# Patient Record
Sex: Female | Born: 1995 | Race: Black or African American | Hispanic: No | Marital: Single | Smoking: Never smoker
Health system: Southern US, Community
[De-identification: ages and names within clinical notes are randomized; demographics above are authoritative.]

## PROBLEM LIST (undated history)

## (undated) ENCOUNTER — Inpatient Hospital Stay (HOSPITAL_COMMUNITY): Payer: Self-pay

## (undated) DIAGNOSIS — R55 Syncope and collapse: Secondary | ICD-10-CM

## (undated) DIAGNOSIS — E669 Obesity, unspecified: Secondary | ICD-10-CM

## (undated) DIAGNOSIS — N946 Dysmenorrhea, unspecified: Secondary | ICD-10-CM

## (undated) DIAGNOSIS — F39 Unspecified mood [affective] disorder: Secondary | ICD-10-CM

## (undated) DIAGNOSIS — Z8619 Personal history of other infectious and parasitic diseases: Secondary | ICD-10-CM

## (undated) HISTORY — PX: WISDOM TOOTH EXTRACTION: SHX21

## (undated) HISTORY — DX: Personal history of other infectious and parasitic diseases: Z86.19

## (undated) HISTORY — PX: EYE SURGERY: SHX253

## (undated) HISTORY — PX: TYMPANOSTOMY TUBE PLACEMENT: SHX32

---
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
## 2015-01-15 NOTE — L&D Delivery Note (Addendum)
Pt complete and at +2 station with urge to push. Epidural controlling pain. Pt pushed twice to deliver a viable female infant in LOA position over intact perineum. Nuchal x 1 reduced over infants head. Anterior and posterior shoulders spontaneously delivered with next two pushes; body easily followed next. Infant placed on mothers abdomen and bulb suction of mouth and nose performed. Cord was then clamped and cut by FOB. Cord blood obtained. Baby had a vigorous spontaneous cry noted. Placenta then delivered about 3 mins later intact, 3VC shultz. Fundal massage performed and pitocin per protocol. Fundus firm.Vaginal inspection noted two small periurethral lacs which were each repaired with one figure 8 stitch using 4-0 vicryl suture. A left labial cyst was punctured and frained of fluid. Mother and baby stable. Counts correct

## 2015-01-18 ENCOUNTER — Inpatient Hospital Stay (HOSPITAL_COMMUNITY)
Admission: AD | Admit: 2015-01-18 | Discharge: 2015-01-18 | Disposition: A | Discharge status: Home / Self Care | Payer: Medicaid Other | Source: Ambulatory Visit | Attending: Obstetrics and Gynecology | Admitting: Obstetrics and Gynecology

## 2015-01-18 ENCOUNTER — Encounter (HOSPITAL_COMMUNITY): Payer: Self-pay | Admitting: *Deleted

## 2015-01-18 DIAGNOSIS — Z3A38 38 weeks gestation of pregnancy: Secondary | ICD-10-CM | POA: Diagnosis not present

## 2015-01-18 DIAGNOSIS — N898 Other specified noninflammatory disorders of vagina: Secondary | ICD-10-CM | POA: Insufficient documentation

## 2015-01-18 DIAGNOSIS — O26893 Other specified pregnancy related conditions, third trimester: Secondary | ICD-10-CM | POA: Diagnosis not present

## 2015-01-18 LAB — POCT FERN TEST: POCT FERN TEST: NEGATIVE

## 2015-01-18 NOTE — MAU Note (Signed)
Been having trickling in underwear, noted this morning. Also noted some mucous. No blood. No pains today.

## 2015-01-18 NOTE — Progress Notes (Signed)
May D/C home 

## 2015-02-01 ENCOUNTER — Telehealth (HOSPITAL_COMMUNITY): Payer: Self-pay | Admitting: *Deleted

## 2015-02-01 ENCOUNTER — Encounter (HOSPITAL_COMMUNITY): Payer: Self-pay | Admitting: *Deleted

## 2015-02-01 ENCOUNTER — Inpatient Hospital Stay (HOSPITAL_COMMUNITY): Admission: RE | Admit: 2015-02-01 | Payer: Medicaid Other | Source: Ambulatory Visit

## 2015-02-01 NOTE — Telephone Encounter (Signed)
Preadmission screen  

## 2015-02-02 ENCOUNTER — Encounter (HOSPITAL_COMMUNITY): Payer: Self-pay

## 2015-02-02 ENCOUNTER — Inpatient Hospital Stay (HOSPITAL_COMMUNITY)
Admission: RE | Admit: 2015-02-02 | Discharge: 2015-02-05 | DRG: 775 | Disposition: A | Discharge status: Home / Self Care | Payer: Medicaid Other | Source: Ambulatory Visit | Attending: Obstetrics and Gynecology | Admitting: Obstetrics and Gynecology

## 2015-02-02 DIAGNOSIS — Z833 Family history of diabetes mellitus: Secondary | ICD-10-CM | POA: Diagnosis not present

## 2015-02-02 DIAGNOSIS — O480 Post-term pregnancy: Principal | ICD-10-CM | POA: Diagnosis present

## 2015-02-02 DIAGNOSIS — Z3A40 40 weeks gestation of pregnancy: Secondary | ICD-10-CM

## 2015-02-02 DIAGNOSIS — Z8249 Family history of ischemic heart disease and other diseases of the circulatory system: Secondary | ICD-10-CM

## 2015-02-02 LAB — CBC
HEMATOCRIT: 35.1 % — AB (ref 36.0–46.0)
Hemoglobin: 12.2 g/dL (ref 12.0–15.0)
MCH: 28 pg (ref 26.0–34.0)
MCHC: 34.8 g/dL (ref 30.0–36.0)
MCV: 80.7 fL (ref 78.0–100.0)
Platelets: 268 10*3/uL (ref 150–400)
RBC: 4.35 MIL/uL (ref 3.87–5.11)
RDW: 15 % (ref 11.5–15.5)
WBC: 8.9 10*3/uL (ref 4.0–10.5)

## 2015-02-02 LAB — TYPE AND SCREEN
ABO/RH(D): AB POS
ANTIBODY SCREEN: NEGATIVE

## 2015-02-02 LAB — RPR: RPR Ser Ql: NONREACTIVE

## 2015-02-02 LAB — ABO/RH: ABO/RH(D): AB POS

## 2015-02-02 MED ORDER — TERBUTALINE SULFATE 1 MG/ML IJ SOLN
0.2500 mg | Freq: Once | INTRAMUSCULAR | Status: DC | PRN
  Filled 2015-02-02: qty 1

## 2015-02-02 MED ORDER — LACTATED RINGERS IV SOLN: 500.0000 mL | INTRAVENOUS | Status: DC | PRN

## 2015-02-02 MED ORDER — OXYCODONE-ACETAMINOPHEN 5-325 MG PO TABS: 2.0000 | ORAL_TABLET | ORAL | Status: DC | PRN

## 2015-02-02 MED ORDER — OXYTOCIN 10 UNIT/ML IJ SOLN
2.5000 [IU]/h | INTRAMUSCULAR | Status: DC
  Filled 2015-02-02: qty 4

## 2015-02-02 MED ORDER — LIDOCAINE HCL (PF) 1 % IJ SOLN
30.0000 mL | INTRAMUSCULAR | Status: AC | PRN
  Administered 2015-02-03: 30 mL via SUBCUTANEOUS
  Filled 2015-02-02: qty 30

## 2015-02-02 MED ORDER — OXYTOCIN BOLUS FROM INFUSION
500.0000 mL | INTRAVENOUS | Status: DC
  Administered 2015-02-03: 500 mL via INTRAVENOUS

## 2015-02-02 MED ORDER — OXYTOCIN 10 UNIT/ML IJ SOLN
1.0000 m[IU]/min | INTRAMUSCULAR | Status: DC
  Administered 2015-02-02: 2 m[IU]/min via INTRAVENOUS

## 2015-02-02 MED ORDER — OXYCODONE-ACETAMINOPHEN 5-325 MG PO TABS: 1.0000 | ORAL_TABLET | ORAL | Status: DC | PRN

## 2015-02-02 MED ORDER — BUTORPHANOL TARTRATE 1 MG/ML IJ SOLN
2.0000 mg | Freq: Once | INTRAMUSCULAR | Status: AC
  Administered 2015-02-02: 2 mg via INTRAVENOUS
  Filled 2015-02-02: qty 2

## 2015-02-02 MED ORDER — MISOPROSTOL 25 MCG QUARTER TABLET
25.0000 ug | ORAL_TABLET | ORAL | Status: DC | PRN
  Administered 2015-02-02 (×3): 25 ug via VAGINAL
  Filled 2015-02-02: qty 0.25
  Filled 2015-02-02: qty 1
  Filled 2015-02-02 (×2): qty 0.25

## 2015-02-02 MED ORDER — LACTATED RINGERS IV SOLN
INTRAVENOUS | Status: DC
  Administered 2015-02-02: 08:00:00 via INTRAVENOUS
  Administered 2015-02-03: 1000 mL via INTRAVENOUS

## 2015-02-02 MED ORDER — ONDANSETRON HCL 4 MG/2ML IJ SOLN: 4.0000 mg | Freq: Four times a day (QID) | INTRAMUSCULAR | Status: DC | PRN

## 2015-02-02 MED ORDER — CITRIC ACID-SODIUM CITRATE 334-500 MG/5ML PO SOLN: 30.0000 mL | ORAL | Status: DC | PRN

## 2015-02-02 MED ORDER — ACETAMINOPHEN 325 MG PO TABS: 650.0000 mg | ORAL_TABLET | ORAL | Status: DC | PRN

## 2015-02-02 NOTE — Progress Notes (Signed)
Patient ID: Alicia Ibarra, female   DOB: August 01, 1995, 20 y.o.   MRN: [id number] Pt reports +Fms, no VB, LOF or painful contractions, SVE - soft but still fingertip EFM- 155 , cat 1 TOCO- rare contractions SVE - ft/75/-2 very soft, mid position  A/P: Second dose of cytotec placed         Reasses in 3-4hrs         Anticipate svd         Pain control prn

## 2015-02-02 NOTE — Progress Notes (Signed)
Patient ID: Alicia Ibarra, female   DOB: Jun 21, 1995, 20 y.o.   MRN: [id number] Pt doing well. No VB or LOF. +Fms. Noting some 'tightening" with contractions VSS Cat 1 strip with baseline 150s TOCO - contractions q 4-91mins SVE 1/80/-2  A/P: third dose of cytotec placed         reassess in 3-4weeks or prn         Anticipate svd          Pain control prn

## 2015-02-02 NOTE — Progress Notes (Signed)
Patient ID: Alicia Ibarra, female   DOB: 1995/02/18, 20 y.o.   MRN: [id number] Pt had a shower and feels well. No VB, LOF or painful contractions Cat 1 strip Contractiosn q 2-29mins SVE 1.5cm/80/-2  A/P: Attempted AROM but not successful        Will proceed with pitocin and recheck in 3hrs or prn        Will attempt AROM again when able        May consider foley bulb if no further dilation on next check

## 2015-02-02 NOTE — H&P (Signed)
Alicia Ibarra is a 20 y.o. female presenting for scheduled iol for postdates at 39 2/[redacted]wks gestation. Pt has had a benign prenatal course. She begun care at [redacted]wks gestation and first trimester u/s corresponded with her lmp. She denies any painful regular contractions, VB or LOF. She is appreciating FMs. Maternal Medical History:  Reason for admission: Nausea. Scheduled iol for postdates  Contractions: Onset was yesterday.   Frequency: irregular.   Perceived severity is mild.    Fetal activity: Perceived fetal activity is normal.   Last perceived fetal movement was within the past hour.    Prenatal complications: no prenatal complications   OB History    Gravida Para Term Preterm AB TAB SAB Ectopic Multiple Living   1              Past Medical History  Diagnosis Date  . Dysmenorrhea in the adolescent   . Obesity   . Syncope   . Mood disorder (HCC)   . Hx of varicella    Past Surgical History  Procedure Laterality Date  . Tympanostomy tube placement    . Wisdom tooth extraction    . Eye surgery     Family History: family history includes Bipolar disorder in her mother; Diabetes in her father; Heart disease in her father; Hypertension in her father; Sudden death in her father. Social History:  reports that she has never smoked. She does not have any smokeless tobacco history on file. She reports that she does not drink alcohol or use illicit drugs.   Prenatal Transfer Tool  Maternal Diabetes: No Genetic Screening: Normal Maternal Ultrasounds/Referrals: Normal Fetal Ultrasounds or other Referrals:  None Maternal Substance Abuse:  No Significant Maternal Medications:  None Significant Maternal Lab Results:  Lab values include: Group B Strep negative Other Comments:  None  Review of Systems  Constitutional: Negative for fever, chills, weight loss and malaise/fatigue.  Eyes: Negative for blurred vision.  Respiratory: Negative for shortness of breath.   Cardiovascular:  Negative for chest pain.  Gastrointestinal: Negative for heartburn, nausea, vomiting, abdominal pain and diarrhea.  Genitourinary: Negative for dysuria.  Musculoskeletal: Positive for back pain.  Neurological: Negative for dizziness and headaches.  Psychiatric/Behavioral: Negative for depression. The patient is not nervous/anxious.     Dilation: Closed Effacement (%): Thick Exam by:: Gifford Shave RN  Blood pressure 122/78, pulse 90, temperature 98.2 F (36.8 C), temperature source Oral, resp. rate 20, height 5' 7.5" (1.715 m), weight 208 lb (94.348 kg). Maternal Exam:  Uterine Assessment: Contraction strength is mild.  Contraction frequency is irregular.   Abdomen: Patient reports generalized tenderness.  Estimated fetal weight is AGA.   Fetal presentation: vertex  Introitus: Normal vulva. Normal vagina.  Pelvis: adequate for delivery.   Cervix: Cervix evaluated by digital exam.     Physical Exam  Constitutional: She is oriented to person, place, and time. She appears well-developed and well-nourished.  HENT:  Head: Normocephalic.  Neck: Normal range of motion.  Cardiovascular: Normal rate.   Respiratory: Effort normal.  GI: Soft. There is generalized tenderness.  Genitourinary: Vagina normal and uterus normal.  Musculoskeletal: Normal range of motion. She exhibits no edema or tenderness.  Neurological: She is alert and oriented to person, place, and time.  Skin: Skin is warm.  Psychiatric: She has a normal mood and affect. Her behavior is normal. Judgment and thought content normal.    Prenatal labs: ABO, Rh: AB/Positive/-- (07/05 0000) Antibody: Negative (07/05 0000) Rubella: Immune (07/05 0000) RPR: Nonreactive (07/05  0000)  HBsAg: Negative (07/05 0000)  HIV: Non-reactive (07/05 0000)  GBS: Negative (12/15 0000)   Assessment/Plan: 19yo G1P0 at 40 2/[redacted]wks gestation for scheduled postdates iol Will begin with cytotec for cervical ripening Reassess in 3-4hours and  proceed prn Pain control as indicated; epidural when in active labor Anticipate svd   Sharol Given Alicia Ibarra 02/02/2015, 8:54 AM

## 2015-02-03 ENCOUNTER — Inpatient Hospital Stay (HOSPITAL_COMMUNITY): Payer: Medicaid Other | Admitting: Anesthesiology

## 2015-02-03 ENCOUNTER — Encounter (HOSPITAL_COMMUNITY): Payer: Self-pay

## 2015-02-03 MED ORDER — ACETAMINOPHEN 325 MG PO TABS: 650.0000 mg | ORAL_TABLET | ORAL | Status: DC | PRN

## 2015-02-03 MED ORDER — ONDANSETRON HCL 4 MG/2ML IJ SOLN: 4.0000 mg | INTRAMUSCULAR | Status: DC | PRN

## 2015-02-03 MED ORDER — ONDANSETRON HCL 4 MG PO TABS: 4.0000 mg | ORAL_TABLET | ORAL | Status: DC | PRN

## 2015-02-03 MED ORDER — ZOLPIDEM TARTRATE 5 MG PO TABS: 5.0000 mg | ORAL_TABLET | Freq: Every evening | ORAL | Status: DC | PRN

## 2015-02-03 MED ORDER — EPHEDRINE 5 MG/ML INJ
10.0000 mg | INTRAVENOUS | Status: DC | PRN
Start: 2015-02-03 — End: 2015-02-03
  Filled 2015-02-03: qty 2

## 2015-02-03 MED ORDER — WITCH HAZEL-GLYCERIN EX PADS: 1.0000 "application " | MEDICATED_PAD | CUTANEOUS | Status: DC | PRN

## 2015-02-03 MED ORDER — IBUPROFEN 600 MG PO TABS
600.0000 mg | ORAL_TABLET | Freq: Four times a day (QID) | ORAL | Status: DC
  Administered 2015-02-03 – 2015-02-04 (×6): 600 mg via ORAL
  Filled 2015-02-03 (×8): qty 1

## 2015-02-03 MED ORDER — SENNOSIDES-DOCUSATE SODIUM 8.6-50 MG PO TABS
2.0000 | ORAL_TABLET | ORAL | Status: DC
  Administered 2015-02-04: 2 via ORAL
  Filled 2015-02-03 (×2): qty 2

## 2015-02-03 MED ORDER — TETANUS-DIPHTH-ACELL PERTUSSIS 5-2.5-18.5 LF-MCG/0.5 IM SUSP: 0.5000 mL | Freq: Once | INTRAMUSCULAR | Status: DC

## 2015-02-03 MED ORDER — DIBUCAINE 1 % RE OINT: 1.0000 "application " | TOPICAL_OINTMENT | RECTAL | Status: DC | PRN

## 2015-02-03 MED ORDER — FENTANYL 2.5 MCG/ML BUPIVACAINE 1/10 % EPIDURAL INFUSION (WH - ANES)
14.0000 mL/h | INTRAMUSCULAR | Status: DC | PRN
  Administered 2015-02-03: 14 mL/h via EPIDURAL
  Filled 2015-02-03: qty 125

## 2015-02-03 MED ORDER — LANOLIN HYDROUS EX OINT: TOPICAL_OINTMENT | CUTANEOUS | Status: DC | PRN

## 2015-02-03 MED ORDER — LIDOCAINE HCL (PF) 1 % IJ SOLN
INTRAMUSCULAR | Status: DC | PRN
  Administered 2015-02-03 (×2): 8 mL via EPIDURAL

## 2015-02-03 MED ORDER — DIPHENHYDRAMINE HCL 25 MG PO CAPS: 25.0000 mg | ORAL_CAPSULE | Freq: Four times a day (QID) | ORAL | Status: DC | PRN

## 2015-02-03 MED ORDER — PHENYLEPHRINE 40 MCG/ML (10ML) SYRINGE FOR IV PUSH (FOR BLOOD PRESSURE SUPPORT)
80.0000 ug | PREFILLED_SYRINGE | INTRAVENOUS | Status: DC | PRN
  Filled 2015-02-03: qty 2
  Filled 2015-02-03: qty 20

## 2015-02-03 MED ORDER — LACTATED RINGERS IV SOLN: INTRAVENOUS | Status: DC

## 2015-02-03 MED ORDER — BUTORPHANOL TARTRATE 2 MG/ML IJ SOLN: 2.0000 mg | Freq: Once | INTRAMUSCULAR | Status: DC

## 2015-02-03 MED ORDER — BENZOCAINE-MENTHOL 20-0.5 % EX AERO: 1.0000 "application " | INHALATION_SPRAY | CUTANEOUS | Status: DC | PRN

## 2015-02-03 MED ORDER — DIPHENHYDRAMINE HCL 50 MG/ML IJ SOLN: 12.5000 mg | INTRAMUSCULAR | Status: DC | PRN

## 2015-02-03 MED ORDER — SIMETHICONE 80 MG PO CHEW: 80.0000 mg | CHEWABLE_TABLET | ORAL | Status: DC | PRN

## 2015-02-03 MED ORDER — PRENATAL MULTIVITAMIN CH
1.0000 | ORAL_TABLET | Freq: Every day | ORAL | Status: DC
  Administered 2015-02-03 – 2015-02-04 (×2): 1 via ORAL
  Filled 2015-02-03 (×2): qty 1

## 2015-02-03 NOTE — Lactation Note (Signed)
This note was copied from the chart of Alicia Ibarra. Lactation Consultation Note  Initial visit made.  Breastfeeding consultation services and support information given and reviewed.  Baby is 7 hours old and has not latched but just spoon fed 5 mls of colostrum.  Baby awake and showing some feeding cues.  Mom has large nipples.  Baby opens and holds breast in mouth with sucking attempted.  Baby not bringing tongue down.  Suck training showed to mom with gloved finger.  Baby continues to pull tongue up or tongue thrusts.  Recommended mom continue skin to skin along with suck training.  Mom receptive to teaching.  Encouraged to watch for feeding cues and to call for assist prn.  Patient Name: Alicia Rickie Gutierres RUEAV'W Date: 02/03/2015 Reason for consult: Initial assessment   Maternal Data Formula Feeding for Exclusion: No Has patient been taught Hand Expression?: Yes Does the patient have breastfeeding experience prior to this delivery?: No  Feeding Feeding Type: Breast Fed Length of feed: 0 min  LATCH Score/Interventions Latch: Repeated attempts needed to sustain latch, nipple held in mouth throughout feeding, stimulation needed to elicit sucking reflex. Intervention(s): Skin to skin;Teach feeding cues;Waking techniques Intervention(s): Breast compression;Breast massage;Assist with latch;Adjust position  Audible Swallowing: None  Type of Nipple: Everted at rest and after stimulation  Comfort (Breast/Nipple): Soft / non-tender     Hold (Positioning): Assistance needed to correctly position infant at breast and maintain latch. Intervention(s): Breastfeeding basics reviewed;Support Pillows;Position options;Skin to skin  LATCH Score: 6  Lactation Tools Discussed/Used     Consult Status Consult Status: Follow-up Date: 02/04/15 Follow-up type: In-patient    Huston Foley 02/03/2015, 2:52 PM

## 2015-02-03 NOTE — Anesthesia Procedure Notes (Signed)
Epidural Patient location during procedure: OB Start time: 02/03/2015 2:32 AM End time: 02/03/2015 2:36 AM  Staffing Anesthesiologist: Leilani Able Performed by: anesthesiologist   Preanesthetic Checklist Completed: patient identified, surgical consent, pre-op evaluation, timeout performed, IV checked, risks and benefits discussed and monitors and equipment checked  Epidural Patient position: sitting Prep: site prepped and draped and DuraPrep Patient monitoring: continuous pulse ox and blood pressure Approach: midline Location: L3-L4 Injection technique: LOR air  Needle:  Needle type: Tuohy  Needle gauge: 17 G Needle length: 9 cm and 9 Needle insertion depth: 7 cm Catheter type: closed end flexible Catheter size: 19 Gauge Catheter at skin depth: 12 cm Test dose: negative and Other  Assessment Sensory level: T9 Events: blood not aspirated, injection not painful, no injection resistance, negative IV test and no paresthesia  Additional Notes Reason for block:procedure for pain

## 2015-02-03 NOTE — Anesthesia Preprocedure Evaluation (Signed)
Anesthesia Evaluation  Patient identified by MRN, date of birth, ID band Patient awake    Reviewed: Allergy & Precautions, H&P , NPO status , Patient's Chart, lab work & pertinent test results  Airway Mallampati: I  TM Distance: >3 FB Neck ROM: full    Dental no notable dental hx.    Pulmonary neg pulmonary ROS,    Pulmonary exam normal        Cardiovascular negative cardio ROS Normal cardiovascular exam     Neuro/Psych negative neurological ROS     GI/Hepatic negative GI ROS, Neg liver ROS,   Endo/Other  negative endocrine ROS  Renal/GU negative Renal ROS     Musculoskeletal   Abdominal (+) + obese,   Peds  Hematology negative hematology ROS (+)   Anesthesia Other Findings   Reproductive/Obstetrics (+) Pregnancy                             Anesthesia Physical Anesthesia Plan  ASA: II  Anesthesia Plan: Epidural   Post-op Pain Management:    Induction:   Airway Management Planned:   Additional Equipment:   Intra-op Plan:   Post-operative Plan:   Informed Consent: I have reviewed the patients History and Physical, chart, labs and discussed the procedure including the risks, benefits and alternatives for the proposed anesthesia with the patient or authorized representative who has indicated his/her understanding and acceptance.     Plan Discussed with:   Anesthesia Plan Comments:         Anesthesia Quick Evaluation  

## 2015-02-03 NOTE — Progress Notes (Signed)
Patient ID: Alicia Ibarra, female   DOB: 20-Sep-1995, 20 y.o.   MRN: [id number] Pt comfortable with epidural. No complaints. +Fms VSS CAT 1 Ctxs q 2-23mins SVE 9.5/100/0  A/P: Progressing well on pitocin         Anticipate svd

## 2015-02-03 NOTE — Progress Notes (Deleted)
Patient ID: Alicia Ibarra, female   DOB: 1995/11/12, 20 y.o.   MRN: [id number] Late entry note Pt seen. No new complaints. +Fms, no VB or contractiosn or LOF VSS FHTs nl x 2  A/P: HD#7        Mono di twins at 63 3/7weeks s/p cerclage revision, PPROM        On day 6 of antibiotics        S/P betamethasone        Reviewed u/s results - stable afi- unchanged; will do weekly afi and twice weekly growth scans

## 2015-02-03 NOTE — Anesthesia Postprocedure Evaluation (Signed)
Anesthesia Post Note  Patient: Alicia Ibarra  Procedure(s) Performed: * No procedures listed *  Patient location during evaluation: Mother Baby Anesthesia Type: Epidural Level of consciousness: oriented and awake and alert Pain management: pain level controlled Vital Signs Assessment: post-procedure vital signs reviewed and stable Respiratory status: spontaneous breathing and respiratory function stable Cardiovascular status: blood pressure returned to baseline Postop Assessment: no headache, no backache, patient able to bend at knees, no signs of nausea or vomiting and adequate PO intake Anesthetic complications: no    Last Vitals:  Filed Vitals:   02/03/15 1015 02/03/15 1415  BP: 116/72 114/60  Pulse: 80 85  Temp: 36.7 C 37.1 C  Resp: 18 16    Last Pain:  Filed Vitals:   02/03/15 1438  PainSc: 0-No pain                 Chakira Jachim

## 2015-02-04 LAB — CBC
HCT: 31.2 % — ABNORMAL LOW (ref 36.0–46.0)
HEMOGLOBIN: 10.7 g/dL — AB (ref 12.0–15.0)
MCH: 28 pg (ref 26.0–34.0)
MCHC: 34.3 g/dL (ref 30.0–36.0)
MCV: 81.7 fL (ref 78.0–100.0)
PLATELETS: 241 10*3/uL (ref 150–400)
RBC: 3.82 MIL/uL — AB (ref 3.87–5.11)
RDW: 15.2 % (ref 11.5–15.5)
WBC: 14.5 10*3/uL — AB (ref 4.0–10.5)

## 2015-02-04 LAB — CCBB MATERNAL DONOR DRAW

## 2015-02-04 NOTE — Progress Notes (Signed)
Post Partum Day 1 Subjective: no complaints and tolerating PO  Objective: Blood pressure 110/67, pulse 103, temperature 98.3 F (36.8 C), temperature source Oral, resp. rate 18, height 5' 7.5" (1.715 m), weight 94.348 kg (208 lb), SpO2 98 %, unknown if currently breastfeeding.  Physical Exam:  General: alert and cooperative Lochia: appropriate Uterine Fundus: firm    Recent Labs  02/02/15 0820 02/04/15 0550  HGB 12.2 10.7*  HCT 35.1* 31.2*    Assessment/Plan: Plan for discharge tomorrow   LOS: 2 days   Alicia Ibarra W 02/04/2015, 11:04 AM

## 2015-02-04 NOTE — Progress Notes (Signed)
Patient has expressed interest in the Nexplanon for contraception.

## 2015-02-04 NOTE — Lactation Note (Signed)
This note was copied from the chart of Alicia Ibarra. Lactation Consultation Note  Patient Name: Alicia Ivett Luebbe [id number] Date: 02/04/2015 Reason for consult: Follow-up assessment  With this mom of a term baby, now 23 hours old. Mom had trouble latching the baby, and fed a bottle of formula with the last 3 feeding. She allowed me to help her latch christina in cross cradle hold. It took a few tries, but once latched, he pulled her breast tissue in, deep, with visible swallows, and good breast movement. Mom had discomfort for the first minute, but after that none. Mom smiling, happy to see her baby latched a feeding well. Mom has easily expressed colostrum. She knows to pump if baby does not latch.  , and to call if having trouble getting him latched.    Maternal Data    Feeding Feeding Type: Breast Fed  LATCH Score/Interventions Latch: Repeated attempts needed to sustain latch, nipple held in mouth throughout feeding, stimulation needed to elicit sucking reflex. Intervention(s): Skin to skin;Teach feeding cues;Waking techniques Intervention(s): Adjust position;Assist with latch;Breast compression  Audible Swallowing: A few with stimulation Intervention(s): Hand expression  Type of Nipple: Everted at rest and after stimulation  Comfort (Breast/Nipple): Filling, red/small blisters or bruises, mild/mod discomfort  Problem noted: Mild/Moderate discomfort (nipple large, baby initially probably not deep enough)  Hold (Positioning): Assistance needed to correctly position infant at breast and maintain latch. Intervention(s): Breastfeeding basics reviewed;Support Pillows;Position options;Skin to skin  LATCH Score: 6  Lactation Tools Discussed/Used Pump Review: Setup, frequency, and cleaning   Consult Status Consult Status: Follow-up Date: 02/05/15 Follow-up type: Call as needed    Alfred Levins 02/04/2015, 3:23 PM

## 2015-02-04 NOTE — Discharge Summary (Signed)
Obstetric Discharge Summary Reason for Admission: induction of labor Prenatal Procedures: none Intrapartum Procedures: spontaneous vaginal delivery Postpartum Procedures: none Complications-Operative and Postpartum: periurethral abrasions and labial cyst drainage HEMOGLOBIN  Date Value Ref Range Status  02/04/2015 10.7* 12.0 - 15.0 g/dL Final   HCT  Date Value Ref Range Status  02/04/2015 31.2* 36.0 - 46.0 % Final    Physical Exam:  General: alert and cooperative Lochia: appropriate Uterine Fundus: firm   Discharge Diagnoses: Term Pregnancy-delivered  Discharge Information: Date: 02/05/2015 Activity: pelvic rest Diet: routine Medications: Ibuprofen Condition: improved Instructions: refer to practice specific booklet Discharge to: home Follow-up Information    Follow up with [facility], DO In 6 weeks.   Specialty:  Obstetrics and Gynecology   Why:  6 weeks postpartum check   Contact information:   [address] [place] [address] [name] [address] [phone]       Newborn Data: Live born female  Birth Weight: 7 lb 4.6 oz (3305 g) APGAR: 7, 8  Home with mother.  Alicia Ibarra W 02/05/2015, 10:00 AM

## 2015-02-04 NOTE — Progress Notes (Signed)
Checked on patients needs.  °Spanish Interpreter  °

## 2015-02-05 MED ORDER — IBUPROFEN 600 MG PO TABS: 600.0000 mg | ORAL_TABLET | Freq: Four times a day (QID) | ORAL | Status: AC

## 2015-02-05 NOTE — Progress Notes (Signed)
Post Partum Day 2 Subjective: no complaints and tolerating PO  Objective: Blood pressure 112/75, pulse 83, temperature 98.5 F (36.9 C), temperature source Oral, resp. rate 16, height 5' 7.5" (1.715 m), weight 94.348 kg (208 lb), SpO2 98 %, unknown if currently breastfeeding.  Physical Exam:  General: alert and cooperative Lochia: appropriate Uterine Fundus: firm    Recent Labs  02/04/15 0550  HGB 10.7*  HCT 31.2*    Assessment/Plan: Discharge home  Plans nexplanon for birth control   LOS: 3 days   Arayna Illescas W 02/05/2015, 9:58 AM

## 2015-02-05 NOTE — Lactation Note (Signed)
This note was copied from the chart of Alicia Ibarra. Lactation Consultation Note  Patient Name: Alicia Ibarra [id number] Date: 02/05/2015 Reason for consult: Follow-up assessment   With this mom and term baby, now 5 hours old. Mom has been putting baby to breast with cues, and then allowing him to bottle feed ad lib. I explained how this will cause mom to loose her milk supply, and to limit the amount of formula offered to the baby, if she wants to continue supplementing. Mom knows to call for questions/concerns or o/p consult.    Maternal Data    Feeding    LATCH Score/Interventions                      Lactation Tools Discussed/Used     Consult Status Consult Status: Complete Follow-up type: Call as needed    Alfred Levins 02/05/2015, 9:02 AM
